# Patient Record
Sex: Female | Born: 1985 | Race: White | Hispanic: No | Marital: Married | Smoking: Former smoker
Health system: Southern US, Community
[De-identification: ages and names within clinical notes are randomized; demographics above are authoritative.]

## PROBLEM LIST (undated history)

## (undated) DIAGNOSIS — F32A Depression, unspecified: Secondary | ICD-10-CM

## (undated) DIAGNOSIS — I82409 Acute embolism and thrombosis of unspecified deep veins of unspecified lower extremity: Secondary | ICD-10-CM

## (undated) DIAGNOSIS — R1310 Dysphagia, unspecified: Secondary | ICD-10-CM

## (undated) DIAGNOSIS — G629 Polyneuropathy, unspecified: Secondary | ICD-10-CM

## (undated) DIAGNOSIS — E109 Type 1 diabetes mellitus without complications: Secondary | ICD-10-CM

## (undated) DIAGNOSIS — E785 Hyperlipidemia, unspecified: Secondary | ICD-10-CM

## (undated) DIAGNOSIS — I469 Cardiac arrest, cause unspecified: Secondary | ICD-10-CM

## (undated) DIAGNOSIS — I10 Essential (primary) hypertension: Secondary | ICD-10-CM

## (undated) DIAGNOSIS — F329 Major depressive disorder, single episode, unspecified: Secondary | ICD-10-CM

## (undated) DIAGNOSIS — M6281 Muscle weakness (generalized): Secondary | ICD-10-CM

## (undated) DIAGNOSIS — I509 Heart failure, unspecified: Secondary | ICD-10-CM

## (undated) DIAGNOSIS — I255 Ischemic cardiomyopathy: Secondary | ICD-10-CM

## (undated) DIAGNOSIS — I219 Acute myocardial infarction, unspecified: Secondary | ICD-10-CM

## (undated) DIAGNOSIS — R569 Unspecified convulsions: Secondary | ICD-10-CM

## (undated) DIAGNOSIS — H3500 Unspecified background retinopathy: Secondary | ICD-10-CM

## (undated) DIAGNOSIS — R4189 Other symptoms and signs involving cognitive functions and awareness: Secondary | ICD-10-CM

## (undated) DIAGNOSIS — D6861 Antiphospholipid syndrome: Secondary | ICD-10-CM

## (undated) DIAGNOSIS — E875 Hyperkalemia: Secondary | ICD-10-CM

## (undated) DIAGNOSIS — I639 Cerebral infarction, unspecified: Secondary | ICD-10-CM

## (undated) DIAGNOSIS — G9341 Metabolic encephalopathy: Secondary | ICD-10-CM

## (undated) HISTORY — DX: Antiphospholipid syndrome: D68.61

## (undated) HISTORY — PX: EYE SURGERY: SHX253

## (undated) HISTORY — DX: Acute embolism and thrombosis of unspecified deep veins of unspecified lower extremity: I82.409

## (undated) HISTORY — DX: Ischemic cardiomyopathy: I25.5

## (undated) HISTORY — DX: Hyperlipidemia, unspecified: E78.5

## (undated) HISTORY — DX: Type 1 diabetes mellitus without complications: E10.9

## (undated) HISTORY — DX: Acute myocardial infarction, unspecified: I21.9

## (undated) HISTORY — DX: Depression, unspecified: F32.A

## (undated) HISTORY — DX: Polyneuropathy, unspecified: G62.9

## (undated) HISTORY — DX: Essential (primary) hypertension: I10

## (undated) HISTORY — DX: Unspecified convulsions: R56.9

## (undated) HISTORY — DX: Other symptoms and signs involving cognitive functions and awareness: R41.89

## (undated) HISTORY — DX: Heart failure, unspecified: I50.9

## (undated) HISTORY — DX: Cerebral infarction, unspecified: I63.9

## (undated) HISTORY — DX: Cardiac arrest, cause unspecified: I46.9

## (undated) HISTORY — PX: CARDIAC SURGERY: SHX584

## (undated) HISTORY — DX: Metabolic encephalopathy: G93.41

## (undated) HISTORY — DX: Major depressive disorder, single episode, unspecified: F32.9

## (undated) HISTORY — PX: FEMORAL BYPASS: SHX50

## (undated) HISTORY — DX: Unspecified background retinopathy: H35.00

## (undated) HISTORY — DX: Muscle weakness (generalized): M62.81

## (undated) HISTORY — DX: Hyperkalemia: E87.5

## (undated) HISTORY — PX: ANKLE SURGERY: SHX546

## (undated) HISTORY — PX: TRACHELECTOMY: SHX6586

## (undated) HISTORY — DX: Dysphagia, unspecified: R13.10

---

## 2015-12-31 ENCOUNTER — Telehealth: Payer: Self-pay | Admitting: Neurology

## 2015-12-31 ENCOUNTER — Other Ambulatory Visit: Payer: Self-pay | Admitting: *Deleted

## 2015-12-31 ENCOUNTER — Encounter: Payer: Self-pay | Admitting: Neurology

## 2015-12-31 ENCOUNTER — Ambulatory Visit (INDEPENDENT_AMBULATORY_CARE_PROVIDER_SITE_OTHER): Payer: Medicare Other | Admitting: Neurology

## 2015-12-31 ENCOUNTER — Encounter: Payer: Self-pay | Admitting: *Deleted

## 2015-12-31 VITALS — BP 127/92 | HR 110

## 2015-12-31 DIAGNOSIS — I639 Cerebral infarction, unspecified: Secondary | ICD-10-CM | POA: Diagnosis not present

## 2015-12-31 DIAGNOSIS — E119 Type 2 diabetes mellitus without complications: Secondary | ICD-10-CM | POA: Insufficient documentation

## 2015-12-31 DIAGNOSIS — R569 Unspecified convulsions: Secondary | ICD-10-CM

## 2015-12-31 MED ORDER — DIVALPROEX SODIUM 125 MG PO CSDR: 500.0000 mg | DELAYED_RELEASE_CAPSULE | Freq: Two times a day (BID) | ORAL | 11 refills | Status: DC

## 2015-12-31 NOTE — Telephone Encounter (Signed)
Faxed order to [facility]

## 2015-12-31 NOTE — Telephone Encounter (Signed)
Kathie RhodesBetty is calling in reference to MRI order, needing to get clarification on Dx. Phone # [phone] and fax # [phone].

## 2015-12-31 NOTE — Telephone Encounter (Signed)
Rosa w/[facility] is calling to confirm medication divalproex (DEPAKOTE SPRINKLES) 125 MG capsule for the patient.  She states the patient takes Keppra instead.

## 2015-12-31 NOTE — Telephone Encounter (Addendum)
Returned call to [place] - she is requesting clarification on Depakote dosage and needs new prescription for liquid form (easier to give in PEG tube). Dr. Terrace ArabiaYan has updated the rx and instructed patient to take both Depakote and Keppra. Faxed to  [phone] - Rosa's attention.

## 2015-12-31 NOTE — Progress Notes (Signed)
PATIENT: Tanya Bentley DOB: 07/02/1985  Chief Complaint  Patient presents with  . Seizures    She is here with her husband, Duffy RhodyStanley, to have her seizure activity evaluated.  Her most recent event was sometime this month.  . Cerebrovascular Accident    She had a stroke on 10/03/15.  She is currently living at the [facility] in [place] for rehab.  Marland Kitchen. PCP    Toma DeitersXaje A Hasanaj, MD    PE HISTORICAL  Tanya Bentley is 30 years old right-handed female, accompanied by her husband, seen in refer by her primary care doctor Toma DeitersXaje A Hasanaj for evaluation of stroke, seizure, initial evaluation was December 31 2015.  She had insulin-dependent diabetes since age 30, with brittle diabetes control, frequently suffered hyper and hypoglycemia episode. She suffered first heart attack in 2014, more extensive heart attack in 2016, with cardiac arrest, she also had antiphospholipid syndrome, had previous multiple miscarriage, now has 2 healthy children at age 30 and 954,  On October 03 2015, while at home, she was noted to have confusion, slurred speech, later developed bilateral droopy eyelid, unequal pupil, then was brought to the local emergency room, lost consciousness about 60 minutes after symptom onset. Her husband,  She was later discharged to rehabilitation, was able to raise her left arm against gravity, bearing weight with her left leg, but over the past few weeks, she was noted to have more declining functional status, could no longer move her left arm, no longer bearing weight with her left leg.  I reviewed laboratory evaluation in November 2017, CBC was elevated 12, hemoglobin 9.8, BMP showed elevated creatinine 1.67, sodium of 150, lipid profile, cholesterol 147, LDL 87, hemoglobin on December 2017 was 8.4  Chest x-ray in December 13 2015, there was no active disease process,  Medication list, amlodipine 10 mg daily, Depakote sprinkle 125 mg daily, Lantus 10 units subcutaneously, Lasix 20 mg daily, Lipitor  40 mg at bedtime, Pepcid 20 mg in the evening, Plavix 75 mg daily, Xelreto 20 mg daily, Keppra 2500 mg twice a day   Reviewed extensive medical record, stroke secondary to thromboembolic event, it happened when she was taking Xelrato, with antiphospholipid antibody, ischemic cardiomyopathy with ejection fraction of 25%, dysphagia, when patient presented to the emergency room at [facility], she had sudden decline, GCS went down to 5, temperature of 32, withdraw to painful stimulation only, per record, she had a massive stroke secondary to subtherapeutic Coumadin level, EEG showed no significant epileptiform discharge, she later had significant dysphagia, status post PEG tube placement, fluctuating glucose level,  Husband also reported a history of seizure, first seizure was in 2005, second seizure was in 2012, generalized tonic-clonic seizure, recurrent seizure in 2015, since her recent stroke in September 2017, she was noted to have recurrent episode of left facial twitching  She now lives in a rehabilitation center, has dense left hemiparesis, nonambulatory, right ptosis, disconjugate eye movement, dysarthria, PEG tube placement, previously had tracheostomy following her cardiac arrest,  REVIEW OF SYSTEMS: Full 14 system review of systems performed and notable only for as above   ALLERGIES: Allergies  Allergen Reactions  . Morphine And Related     Feels like there is "lava inside her brain".  . Tramadol     Caused seizure.    HOME MEDICATIONS: No current outpatient prescriptions on file.   No current facility-administered medications for this visit.     PAST MEDICAL HISTORY: Past Medical History:  Diagnosis Date  .  Antiphospholipid antibody syndrome (HCC)   . Cardiac arrest [facility])    History of ventricular tachycardia arrest.  . Chronic heart failure (HCC)   . CVA (cerebral vascular accident) (HCC)   . Deep vein thrombosis (DVT) (HCC)   . Depression   . Diabetes mellitus  type 1 (HCC)   . Disorder of consciousness   . Dysphagia   . Heart attack   . Hyperkalemia   . Hypertension   . Ischemic cardiomyopathy   . Metabolic encephalopathy   . Neuropathy (HCC)   . Retinopathy   . Seizure (HCC)     PAST SURGICAL HISTORY: Past Surgical History:  Procedure Laterality Date  . ANKLE SURGERY Right   . CARDIAC SURGERY     multiple stents  . CESAREAN SECTION     x 2  . EYE SURGERY Bilateral   . FEMORAL BYPASS Bilateral   . TRACHELECTOMY      FAMILY HISTORY: Family History  Problem Relation Age of Onset  . Depression Mother   . Hypertension Mother   . Heart disease Father     SOCIAL HISTORY:  Social History   Social History  . Marital status: Married    Spouse name: N/A  . Number of children: 2  . Years of education: Some college   Occupational History  . Diabled    Social History Main Topics  . Smoking status: Former Games developer  . Smokeless tobacco: Former Neurosurgeon     Comment: Stopped since stroke - 09/2015.  Marland Kitchen Alcohol use No  . Drug use: No  . Sexual activity: Not on file   Other Topics Concern  . Not on file   Social History Narrative   Currently resides at the [facility] in [place] for rehab from her stroke on 10/03/15.   Right-handed.   No caffeine use.     PHYSICAL EXAM   Vitals:   12/31/15 1020  BP: (!) 127/92  Pulse: (!) 110    Not recorded      There is no height or weight on file to calculate BMI.  PHYSICAL EXAMNIATION:  Gen: NAD, conversant, well nourised, obese, well groomed                     Cardiovascular: Regular rate rhythm, no peripheral edema, warm, nontender. Eyes: Conjunctivae clear without exudates or hemorrhage Neck: Supple, no carotid bruits. Pulmonary: Clear to auscultation bilaterally   NEUROLOGICAL EXAM:  MENTAL STATUS: Speech:    Speech is normal; fluent and spontaneous with normal comprehension.  Cognition:     Orientation to time, place and person     Normal recent and remote memory      Normal Attention span and concentration     Normal Language, naming, repeating,spontaneous speech     Fund of knowledge   CRANIAL NERVES: CN II: Visual fields are full to confrontation. Fundoscopic exam is normal with sharp discs and no vascular changes. Pupils are round equal and briskly reactive to light. CN III, IV, VI: extraocular movement are normal. No ptosis. CN V: Facial sensation is intact to pinprick in all 3 divisions bilaterally. Corneal responses are intact.  CN VII: Face is symmetric with normal eye closure and smile. CN VIII: Hearing is normal to rubbing fingers CN IX, X: Palate elevates symmetrically. Phonation is normal. CN XI: Head turning and shoulder shrug are intact CN XII: Tongue is midline with normal movements and no atrophy.  MOTOR: Dense spastic left hemiparesis, only trace movement of  left upper extremity muscles, no movement on left lower extremity, free movement of right arm, antigravity movement of right lower extremity  REFLEXES: Reflexes are hypoactive. Plantar responses are flexor.  SENSORY: Intact to light touch, pinprick, positional sensation and vibratory sensation are intact in fingers and toes.  COORDINATION: Normal right finger to nose  GAIT/STANCE: Nonambulatory   DIAGNOSTIC DATA (LABS, IMAGING, TESTING) - I reviewed patient records, labs, notes, testing and imaging myself where available.   ASSESSMENT AND PLAN  Tanya Bentley is a 30 y.o. female   Reported history of multiple stroke,   vascular risk factor including brittle type 1 diabetes, antiphospholipid antibody, coronary artery disease, congestive heart failure,  Repeat MRI of the brain without contrast  Epilepsy  Keep Keppra at 1.5 ML= 1500 mg twice a day  Increase Depakote sprinkle 125 mg 4 tablets twice a day  Repeat EEG   Levert FeinsteinYijun Daton Szilagyi, M.D. Ph.D.  [facility] [address], [address] [place], [place] [address] Ph: [phone] Fax: [phone]  CC:  Toma DeitersXaje A Hasanaj, MD

## 2016-01-01 ENCOUNTER — Other Ambulatory Visit: Payer: Self-pay | Admitting: *Deleted

## 2016-01-01 MED ORDER — VALPROATE SODIUM 250 MG/5ML PO SOLN: 500.0000 mg | Freq: Two times a day (BID) | ORAL | 11 refills | Status: AC

## 2016-01-01 MED ORDER — VALPROATE SODIUM 250 MG/5ML PO SOLN: 500.0000 mg | Freq: Two times a day (BID) | ORAL | 11 refills | Status: DC

## 2016-01-13 ENCOUNTER — Ambulatory Visit: Payer: Self-pay | Admitting: Cardiovascular Disease

## 2016-02-06 ENCOUNTER — Other Ambulatory Visit: Payer: Medicare Other

## 2016-02-09 ENCOUNTER — Encounter: Payer: Self-pay | Admitting: Neurology

## 2016-02-09 ENCOUNTER — Ambulatory Visit (HOSPITAL_COMMUNITY)
Admission: AD | Admit: 2016-02-09 | Discharge: 2016-02-09 | Disposition: A | Discharge status: Home / Self Care | Payer: Medicare Other | Source: Other Acute Inpatient Hospital | Attending: Internal Medicine | Admitting: Internal Medicine

## 2016-02-09 ENCOUNTER — Inpatient Hospital Stay
Admission: AD | Admit: 2016-02-09 | Discharge: 2016-03-16 | Disposition: A | Discharge status: Skilled Nursing Facility | Payer: Medicaid Other | Source: Ambulatory Visit | Attending: Internal Medicine | Admitting: Internal Medicine

## 2016-02-09 ENCOUNTER — Institutional Professional Consult (permissible substitution) (HOSPITAL_COMMUNITY): Payer: Medicaid Other

## 2016-02-09 DIAGNOSIS — Z9889 Other specified postprocedural states: Secondary | ICD-10-CM

## 2016-02-09 DIAGNOSIS — J9690 Respiratory failure, unspecified, unspecified whether with hypoxia or hypercapnia: Secondary | ICD-10-CM

## 2016-02-09 DIAGNOSIS — Z930 Tracheostomy status: Secondary | ICD-10-CM

## 2016-02-09 DIAGNOSIS — R0682 Tachypnea, not elsewhere classified: Secondary | ICD-10-CM

## 2016-02-09 DIAGNOSIS — J90 Pleural effusion, not elsewhere classified: Secondary | ICD-10-CM

## 2016-02-09 DIAGNOSIS — E119 Type 2 diabetes mellitus without complications: Secondary | ICD-10-CM | POA: Insufficient documentation

## 2016-02-09 DIAGNOSIS — Z931 Gastrostomy status: Secondary | ICD-10-CM

## 2016-02-09 LAB — BLOOD GAS, ARTERIAL
ACID-BASE EXCESS: 6.8 mmol/L — AB (ref 0.0–2.0)
Bicarbonate: 29.7 mmol/L — ABNORMAL HIGH (ref 20.0–28.0)
FIO2: 0.3
MECHVT: 420 mL
O2 Saturation: 99.4 %
PEEP/CPAP: 5 cmH2O
PO2 ART: 134 mmHg — AB (ref 83.0–108.0)
Patient temperature: 98.6
RATE: 12 resp/min
pCO2 arterial: 33.7 mmHg (ref 32.0–48.0)
pH, Arterial: 7.553 — ABNORMAL HIGH (ref 7.350–7.450)

## 2016-02-09 LAB — PROTIME-INR
INR: 1.57
PROTHROMBIN TIME: 18.9 s — AB (ref 11.4–15.2)

## 2016-02-10 ENCOUNTER — Other Ambulatory Visit (HOSPITAL_COMMUNITY): Payer: Medicaid Other

## 2016-02-10 LAB — CBC
HCT: 30.9 % — ABNORMAL LOW (ref 36.0–46.0)
Hemoglobin: 9.2 g/dL — ABNORMAL LOW (ref 12.0–15.0)
MCH: 29.5 pg (ref 26.0–34.0)
MCHC: 29.8 g/dL — ABNORMAL LOW (ref 30.0–36.0)
MCV: 99 fL (ref 78.0–100.0)
Platelets: 188 10*3/uL (ref 150–400)
RBC: 3.12 MIL/uL — ABNORMAL LOW (ref 3.87–5.11)
RDW: 23 % — AB (ref 11.5–15.5)
WBC: 6 10*3/uL (ref 4.0–10.5)

## 2016-02-10 LAB — BASIC METABOLIC PANEL
Anion gap: 10 (ref 5–15)
BUN: 40 mg/dL — AB (ref 6–20)
CALCIUM: 8.4 mg/dL — AB (ref 8.9–10.3)
CO2: 30 mmol/L (ref 22–32)
CREATININE: 0.78 mg/dL (ref 0.44–1.00)
Chloride: 103 mmol/L (ref 101–111)
GFR calc Af Amer: >60 mL/min (ref >60)
Glucose, Bld: 299 mg/dL — ABNORMAL HIGH (ref 65–99)
Potassium: 4.1 mmol/L (ref 3.5–5.1)
SODIUM: 143 mmol/L (ref 135–145)

## 2016-02-10 LAB — PROTIME-INR
INR: 1.6
PROTHROMBIN TIME: 19.2 s — AB (ref 11.4–15.2)

## 2016-02-10 MED ORDER — DIATRIZOATE MEGLUMINE & SODIUM 66-10 % PO SOLN
ORAL | Status: AC
  Filled 2016-02-10: qty 30

## 2016-02-10 MED ORDER — DIATRIZOATE MEGLUMINE & SODIUM 66-10 % PO SOLN: 30.0000 mL | Freq: Once | ORAL | Status: DC

## 2016-02-11 LAB — PROTIME-INR
INR: 2.1
PROTHROMBIN TIME: 23.9 s — AB (ref 11.4–15.2)

## 2016-02-12 LAB — BASIC METABOLIC PANEL
Anion gap: 12 (ref 5–15)
BUN: 39 mg/dL — AB (ref 6–20)
CALCIUM: 8.4 mg/dL — AB (ref 8.9–10.3)
CO2: 28 mmol/L (ref 22–32)
CREATININE: 0.84 mg/dL (ref 0.44–1.00)
Chloride: 104 mmol/L (ref 101–111)
GFR calc Af Amer: >60 mL/min (ref >60)
Glucose, Bld: 533 mg/dL (ref 65–99)
Potassium: 3.8 mmol/L (ref 3.5–5.1)
SODIUM: 144 mmol/L (ref 135–145)

## 2016-02-12 LAB — PROTIME-INR
INR: 2.05
PROTHROMBIN TIME: 23.4 s — AB (ref 11.4–15.2)

## 2016-02-12 LAB — HEMOGLOBIN A1C
HEMOGLOBIN A1C: 6.7 % — AB (ref 4.8–5.6)
MEAN PLASMA GLUCOSE: 146 mg/dL

## 2016-02-13 LAB — PROTIME-INR
INR: 2.45
PROTHROMBIN TIME: 27 s — AB (ref 11.4–15.2)

## 2016-02-13 LAB — LEVETIRACETAM LEVEL: Levetiracetam Lvl: 1.1 ug/mL — ABNORMAL LOW (ref 10.0–40.0)

## 2016-02-14 LAB — PROTIME-INR
INR: 2.8
PROTHROMBIN TIME: 30.1 s — AB (ref 11.4–15.2)

## 2016-02-14 LAB — VALPROIC ACID LEVEL: Valproic Acid Lvl: 50 ug/mL (ref 50.0–100.0)

## 2016-02-15 LAB — PROTIME-INR
INR: 2.71
Prothrombin Time: 29.3 seconds — ABNORMAL HIGH (ref 11.4–15.2)

## 2016-02-16 ENCOUNTER — Other Ambulatory Visit (HOSPITAL_COMMUNITY): Payer: Medicaid Other

## 2016-02-16 LAB — CBC
HCT: 28.7 % — ABNORMAL LOW (ref 36.0–46.0)
HEMOGLOBIN: 8.6 g/dL — AB (ref 12.0–15.0)
MCH: 29.7 pg (ref 26.0–34.0)
MCHC: 30 g/dL (ref 30.0–36.0)
MCV: 99 fL (ref 78.0–100.0)
Platelets: 169 10*3/uL (ref 150–400)
RBC: 2.9 MIL/uL — ABNORMAL LOW (ref 3.87–5.11)
RDW: 20.3 % — AB (ref 11.5–15.5)
WBC: 4.4 10*3/uL (ref 4.0–10.5)

## 2016-02-16 LAB — BASIC METABOLIC PANEL
ANION GAP: 9 (ref 5–15)
BUN: 41 mg/dL — ABNORMAL HIGH (ref 6–20)
CALCIUM: 8.5 mg/dL — AB (ref 8.9–10.3)
CHLORIDE: 104 mmol/L (ref 101–111)
CO2: 29 mmol/L (ref 22–32)
CREATININE: 0.85 mg/dL (ref 0.44–1.00)
GFR calc non Af Amer: >60 mL/min (ref >60)
Glucose, Bld: 391 mg/dL — ABNORMAL HIGH (ref 65–99)
Potassium: 3.9 mmol/L (ref 3.5–5.1)
SODIUM: 142 mmol/L (ref 135–145)

## 2016-02-16 LAB — PROTIME-INR
INR: 3.36
PROTHROMBIN TIME: 34.8 s — AB (ref 11.4–15.2)

## 2016-02-17 LAB — PROTIME-INR
INR: 3.16
Prothrombin Time: 33.2 seconds — ABNORMAL HIGH (ref 11.4–15.2)

## 2016-02-18 LAB — PROTIME-INR
INR: 1.72
PROTHROMBIN TIME: 20.3 s — AB (ref 11.4–15.2)

## 2016-02-21 LAB — BASIC METABOLIC PANEL
Anion gap: 12 (ref 5–15)
BUN: 31 mg/dL — AB (ref 6–20)
CHLORIDE: 104 mmol/L (ref 101–111)
CO2: 30 mmol/L (ref 22–32)
CREATININE: 0.73 mg/dL (ref 0.44–1.00)
Calcium: 8.4 mg/dL — ABNORMAL LOW (ref 8.9–10.3)
Glucose, Bld: 257 mg/dL — ABNORMAL HIGH (ref 65–99)
POTASSIUM: 3.7 mmol/L (ref 3.5–5.1)
SODIUM: 146 mmol/L — AB (ref 135–145)

## 2016-02-21 LAB — CBC
HCT: 31.9 % — ABNORMAL LOW (ref 36.0–46.0)
Hemoglobin: 9.5 g/dL — ABNORMAL LOW (ref 12.0–15.0)
MCH: 30.2 pg (ref 26.0–34.0)
MCHC: 29.8 g/dL — AB (ref 30.0–36.0)
MCV: 101.3 fL — ABNORMAL HIGH (ref 78.0–100.0)
PLATELETS: 207 10*3/uL (ref 150–400)
RBC: 3.15 MIL/uL — AB (ref 3.87–5.11)
RDW: 20 % — ABNORMAL HIGH (ref 11.5–15.5)
WBC: 10.2 10*3/uL (ref 4.0–10.5)

## 2016-02-21 LAB — PROTIME-INR
INR: 1.11
PROTHROMBIN TIME: 14.4 s (ref 11.4–15.2)

## 2016-02-26 ENCOUNTER — Other Ambulatory Visit (HOSPITAL_COMMUNITY): Payer: Medicaid Other

## 2016-02-26 LAB — URINALYSIS, ROUTINE W REFLEX MICROSCOPIC
Bilirubin Urine: NEGATIVE
Glucose, UA: NEGATIVE mg/dL
Ketones, ur: NEGATIVE mg/dL
Nitrite: NEGATIVE
PH: 5 (ref 5.0–8.0)
Protein, ur: 100 mg/dL — AB
Specific Gravity, Urine: 1.015 (ref 1.005–1.030)

## 2016-02-26 LAB — CBC
HEMATOCRIT: 29.9 % — AB (ref 36.0–46.0)
Hemoglobin: 8.9 g/dL — ABNORMAL LOW (ref 12.0–15.0)
MCH: 29.8 pg (ref 26.0–34.0)
MCHC: 29.8 g/dL — ABNORMAL LOW (ref 30.0–36.0)
MCV: 100 fL (ref 78.0–100.0)
Platelets: 324 10*3/uL (ref 150–400)
RBC: 2.99 MIL/uL — AB (ref 3.87–5.11)
RDW: 19.1 % — ABNORMAL HIGH (ref 11.5–15.5)
WBC: 8.7 10*3/uL (ref 4.0–10.5)

## 2016-02-27 ENCOUNTER — Institutional Professional Consult (permissible substitution) (HOSPITAL_COMMUNITY): Payer: Medicaid Other

## 2016-02-27 LAB — CBC
HEMATOCRIT: 28.3 % — AB (ref 36.0–46.0)
Hemoglobin: 8.6 g/dL — ABNORMAL LOW (ref 12.0–15.0)
MCH: 29.9 pg (ref 26.0–34.0)
MCHC: 30.4 g/dL (ref 30.0–36.0)
MCV: 98.3 fL (ref 78.0–100.0)
Platelets: 313 10*3/uL (ref 150–400)
RBC: 2.88 MIL/uL — ABNORMAL LOW (ref 3.87–5.11)
RDW: 19 % — AB (ref 11.5–15.5)
WBC: 9 10*3/uL (ref 4.0–10.5)

## 2016-02-27 LAB — COMPREHENSIVE METABOLIC PANEL
ALBUMIN: 2 g/dL — AB (ref 3.5–5.0)
ALT: 30 U/L (ref 14–54)
AST: 44 U/L — AB (ref 15–41)
Alkaline Phosphatase: 95 U/L (ref 38–126)
Anion gap: 9 (ref 5–15)
BILIRUBIN TOTAL: 0.5 mg/dL (ref 0.3–1.2)
BUN: 31 mg/dL — AB (ref 6–20)
CHLORIDE: 104 mmol/L (ref 101–111)
CO2: 31 mmol/L (ref 22–32)
CREATININE: 0.74 mg/dL (ref 0.44–1.00)
Calcium: 8.7 mg/dL — ABNORMAL LOW (ref 8.9–10.3)
GFR calc Af Amer: >60 mL/min (ref >60)
GFR calc non Af Amer: >60 mL/min (ref >60)
GLUCOSE: 112 mg/dL — AB (ref 65–99)
POTASSIUM: 4 mmol/L (ref 3.5–5.1)
Sodium: 144 mmol/L (ref 135–145)
Total Protein: 6.4 g/dL — ABNORMAL LOW (ref 6.5–8.1)

## 2016-02-28 LAB — URINE CULTURE

## 2016-02-28 LAB — CULTURE, RESPIRATORY

## 2016-02-28 LAB — CULTURE, RESPIRATORY W GRAM STAIN

## 2016-02-29 ENCOUNTER — Other Ambulatory Visit (HOSPITAL_COMMUNITY): Payer: Medicaid Other

## 2016-02-29 MED ORDER — LIDOCAINE HCL (PF) 1 % IJ SOLN
INTRAMUSCULAR | Status: AC
  Filled 2016-02-29: qty 10

## 2016-03-03 ENCOUNTER — Ambulatory Visit: Payer: Medicare Other | Admitting: Neurology

## 2016-03-07 ENCOUNTER — Other Ambulatory Visit (HOSPITAL_COMMUNITY): Payer: Medicaid Other

## 2016-03-07 LAB — CBC
HCT: 35.6 % — ABNORMAL LOW (ref 36.0–46.0)
HEMOGLOBIN: 10.8 g/dL — AB (ref 12.0–15.0)
MCH: 29.7 pg (ref 26.0–34.0)
MCHC: 30.3 g/dL (ref 30.0–36.0)
MCV: 97.8 fL (ref 78.0–100.0)
Platelets: 315 10*3/uL (ref 150–400)
RBC: 3.64 MIL/uL — AB (ref 3.87–5.11)
RDW: 18.2 % — ABNORMAL HIGH (ref 11.5–15.5)
WBC: 7.9 10*3/uL (ref 4.0–10.5)

## 2016-03-07 LAB — BASIC METABOLIC PANEL
Anion gap: 8 (ref 5–15)
BUN: 29 mg/dL — AB (ref 6–20)
CHLORIDE: 102 mmol/L (ref 101–111)
CO2: 30 mmol/L (ref 22–32)
CREATININE: 0.59 mg/dL (ref 0.44–1.00)
Calcium: 9 mg/dL (ref 8.9–10.3)
GFR calc Af Amer: >60 mL/min (ref >60)
GFR calc non Af Amer: >60 mL/min (ref >60)
Glucose, Bld: 117 mg/dL — ABNORMAL HIGH (ref 65–99)
POTASSIUM: 4.1 mmol/L (ref 3.5–5.1)
SODIUM: 140 mmol/L (ref 135–145)

## 2016-03-08 LAB — PROTEIN, TOTAL: Total Protein: 7.1 g/dL (ref 6.5–8.1)

## 2016-03-09 ENCOUNTER — Other Ambulatory Visit (HOSPITAL_COMMUNITY): Payer: Medicaid Other

## 2016-03-09 LAB — LACTATE DEHYDROGENASE, PLEURAL OR PERITONEAL FLUID: LD, Fluid: 85 U/L — ABNORMAL HIGH (ref 3–23)

## 2016-03-09 LAB — PROTEIN, PLEURAL OR PERITONEAL FLUID: Total protein, fluid: <3 g/dL

## 2016-03-09 LAB — GLUCOSE, PLEURAL OR PERITONEAL FLUID: Glucose, Fluid: 158 mg/dL

## 2016-03-09 MED ORDER — LIDOCAINE HCL 1 % IJ SOLN
INTRAMUSCULAR | Status: AC
  Filled 2016-03-09: qty 20

## 2016-03-09 NOTE — Procedures (Signed)
PROCEDURE SUMMARY:  Successful US guided right thoracentesis. Yielded 1.5 L of serosanguinous pleural fluid. Pt tolerated procedure well. No immediate complications.  Specimen was sent for labs. CXR was deferred as CT Chest has been ordered.  Brayton ElBRUNING, Lorianna Spadaccini PA-C 03/09/2016 10:29 AM

## 2016-03-10 LAB — PH, BODY FLUID: PH, BODY FLUID: 8

## 2016-03-10 LAB — CULTURE, RESPIRATORY W GRAM STAIN

## 2016-03-10 LAB — CULTURE, RESPIRATORY

## 2016-03-10 LAB — GRAM STAIN

## 2016-03-11 ENCOUNTER — Other Ambulatory Visit (HOSPITAL_COMMUNITY): Payer: Medicaid Other

## 2016-03-14 LAB — CULTURE, BODY FLUID W GRAM STAIN -BOTTLE

## 2016-03-14 LAB — CULTURE, BODY FLUID-BOTTLE: CULTURE: NO GROWTH

## 2016-06-11 DEATH — deceased

## 2017-11-24 IMAGING — DX DG CHEST 1V PORT
1 series · 1 of 1 positions shown · non-contrast
Comparison: Portable chest x-ray February 09, 2016

CLINICAL DATA: Respiratory failure, CVA, cardiac arrest, seizure
activity. History of chronic CHF, former smoker.

EXAM:
PORTABLE CHEST 1 VIEW

[chest ap]
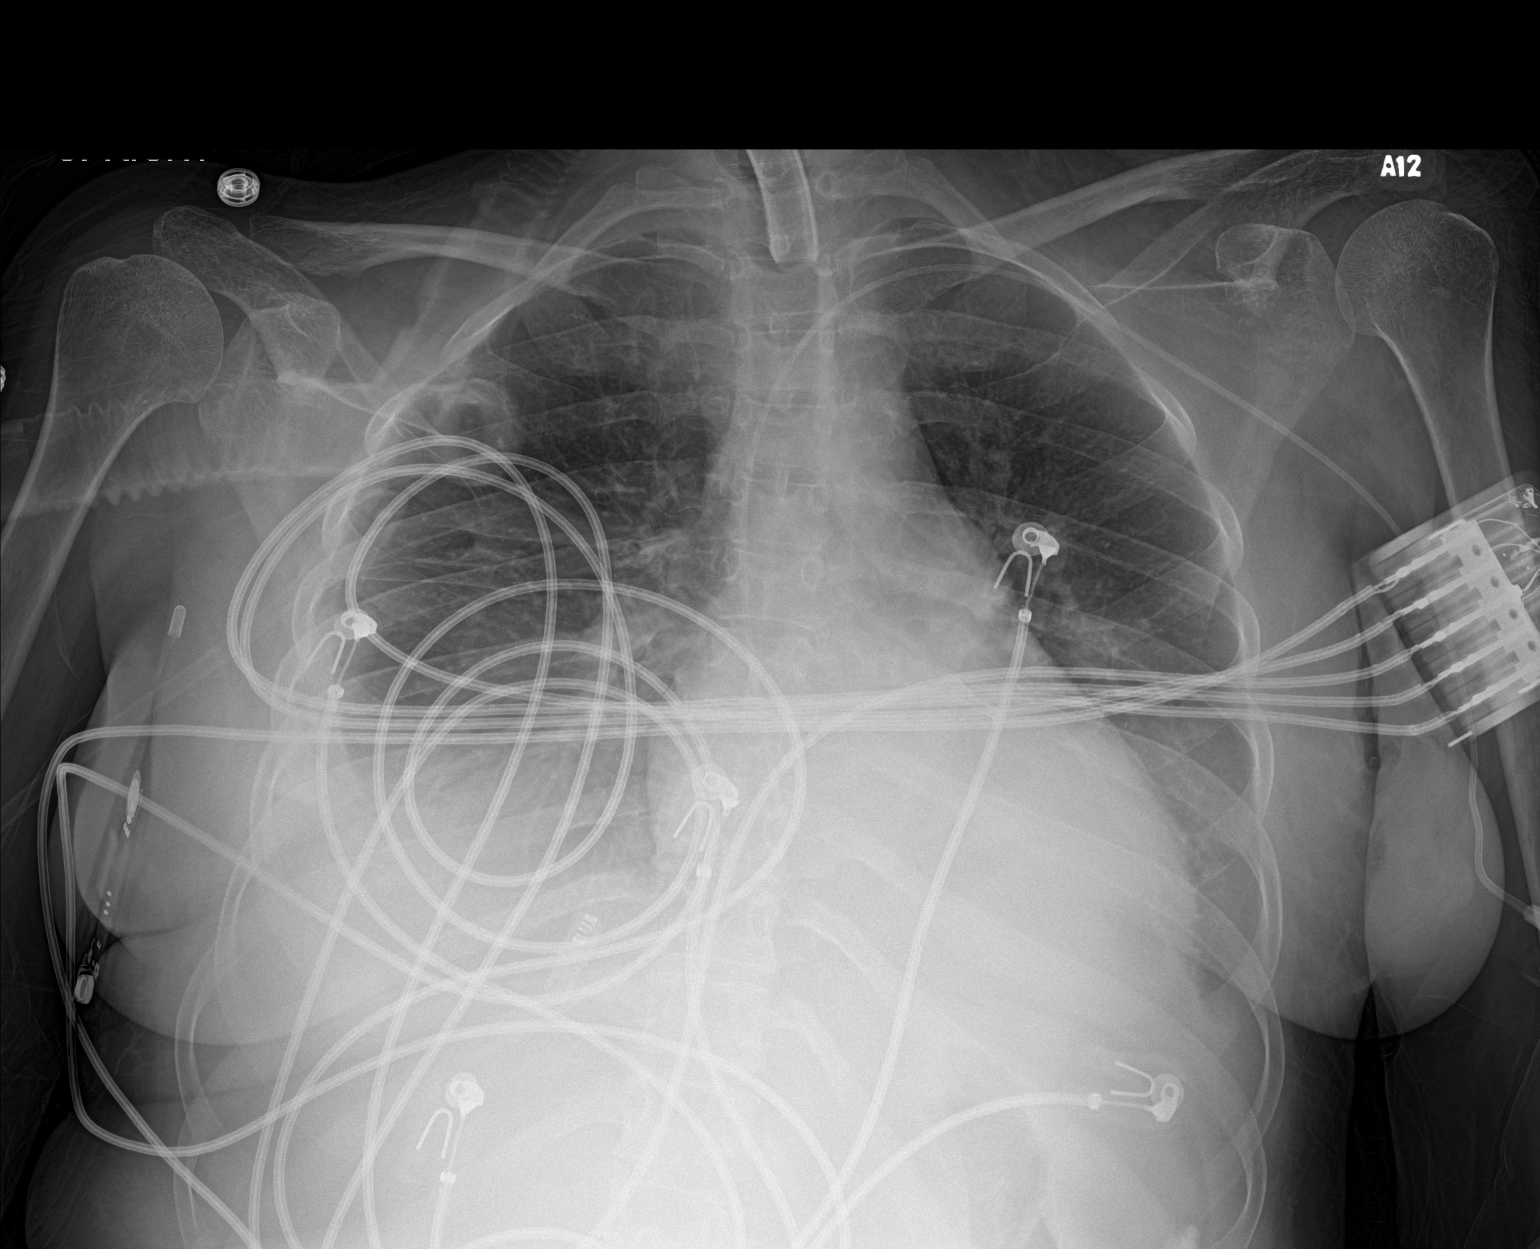

[1 of 1 positions shown; findings below may reference images not displayed]

FINDINGS: The lungs are reasonably well inflated. There is a small right
pleural effusion and trace left pleural effusion. Bibasilar
densities are slightly less conspicuous. The heart and pulmonary
vascularity are normal. The mediastinum is normal in width. The
trachea is midline. There is bibasilar atelectasis or pneumonia. The
tracheostomy tube tip projects at the superior margin of the
clavicular heads. The left-sided PICC line tip projects at the
cavoatrial junction.
IMPRESSION: Slight interval improvement in the appearance of the bibasilar
atelectasis or pneumonia and pleural effusions. No CHF. The
tracheostomy tube tip projects at the superior margin of the
clavicular heads.

## 2017-12-16 IMAGING — CT CT CHEST W/O CM
2 of 3 series · 13 of 36 positions shown, 16 images · non-contrast
Comparison: 03/07/2016 chest x-ray

CLINICAL DATA: Post thoracentesis.  Pleural effusions.

EXAM:
CT CHEST WITHOUT CONTRAST
TECHNIQUE: Multidetector CT imaging of the chest was performed following the
standard protocol without IV contrast.

[Series 201: chest without, idose (2) · axial · non-contrast · 0.66mm/px · z∈[+65,+328]mm · 10 of 125 slices shown, 13 images]
[im 10/125  mediastinal]
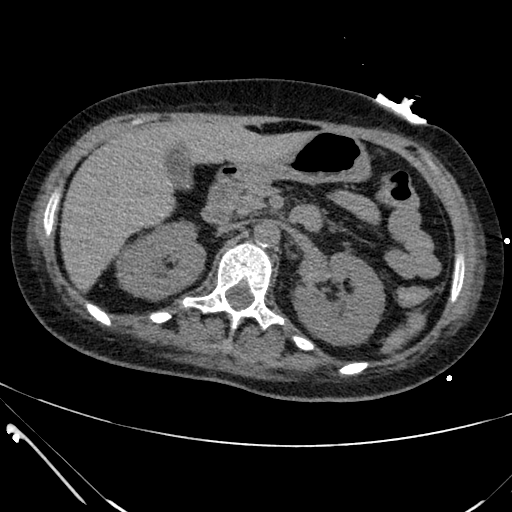
[im 10/125  lung]
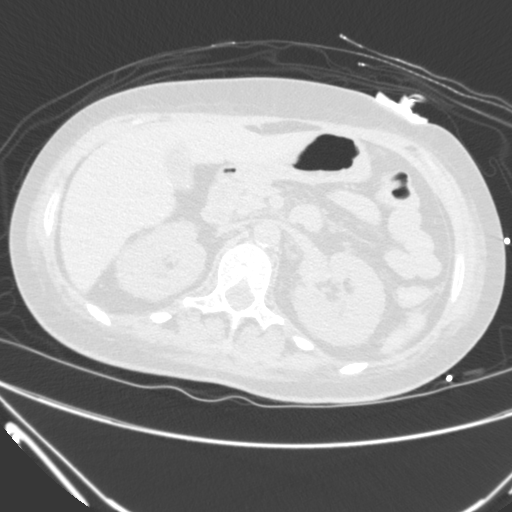
[im 19/125  lung]
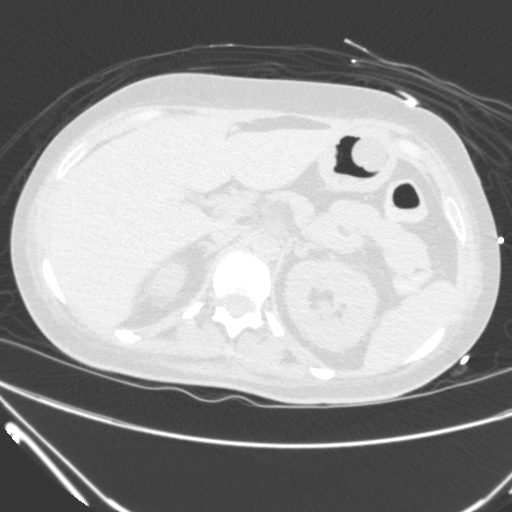
[im 33/125  lung]
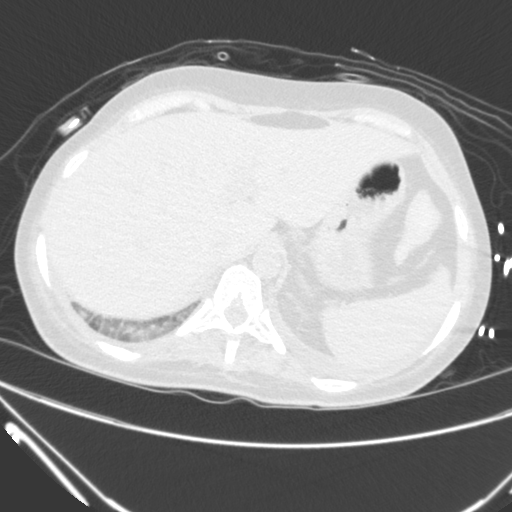
[im 46/125  lung]
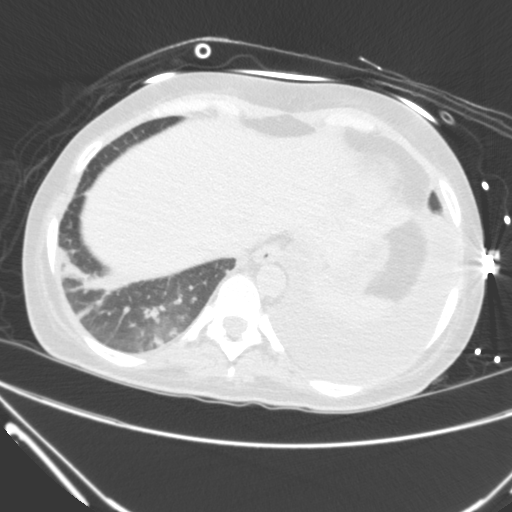
[im 56/125  mediastinal]
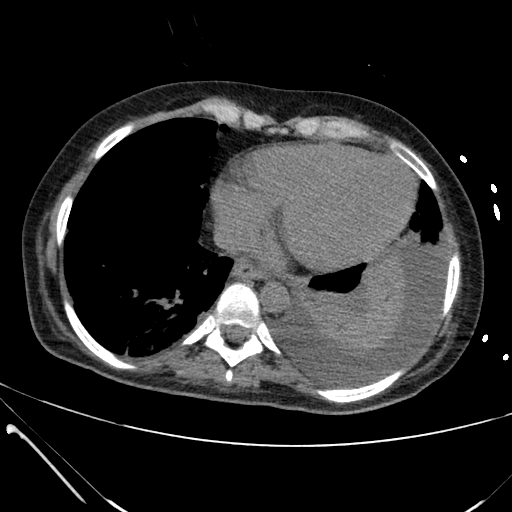
[im 56/125  lung]
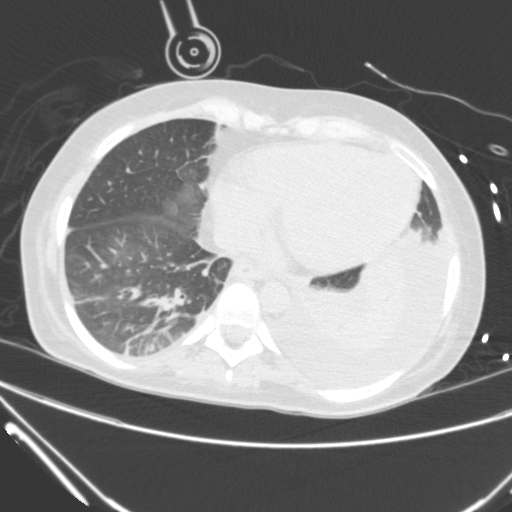
[im 69/125  lung]
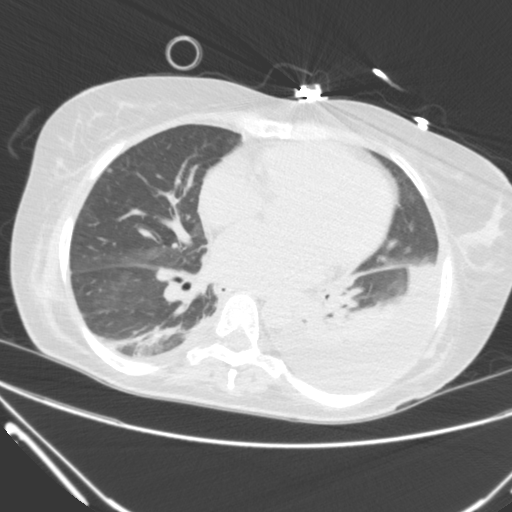
[im 79/125  lung]
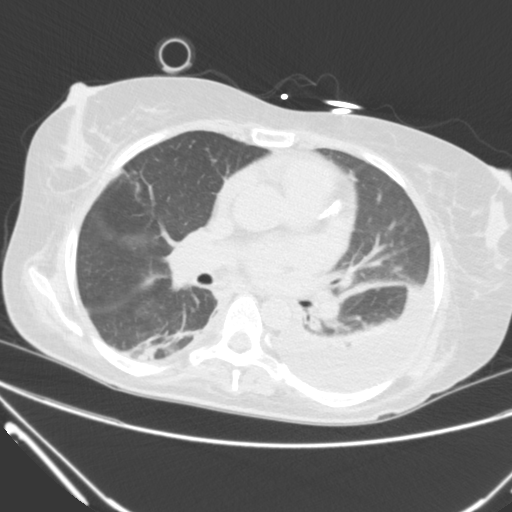
[im 92/125  lung]
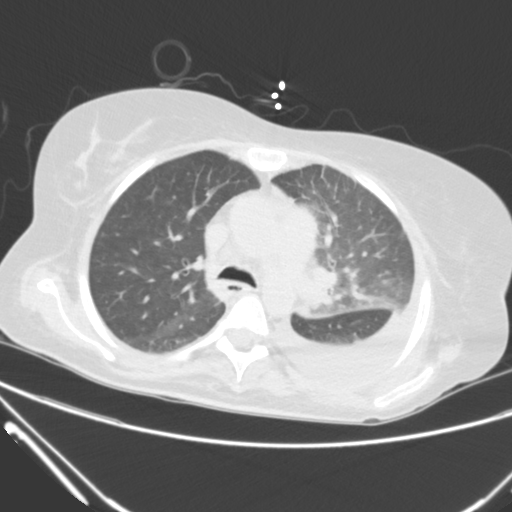
[im 106/125  mediastinal]
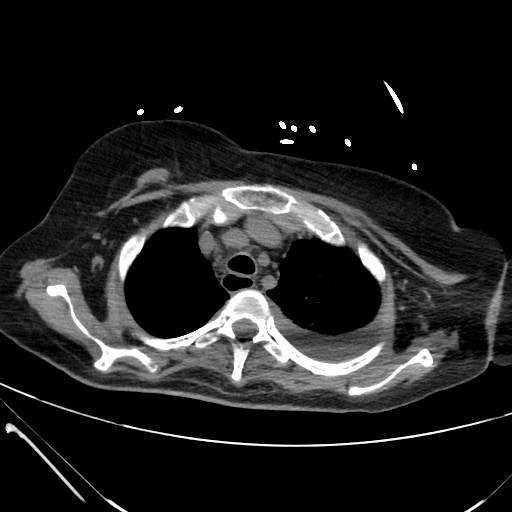
[im 106/125  lung]
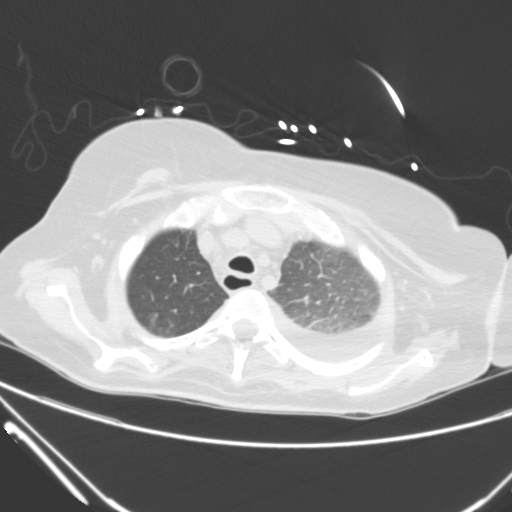
[im 115/125  lung]
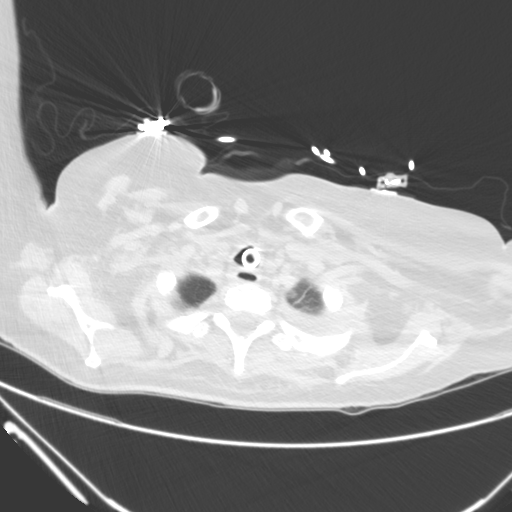

[Series 203: coronal, idose (2) · coronal · 0.45mm/px · 3 of 94 slices shown]
[im 19/94  lung]
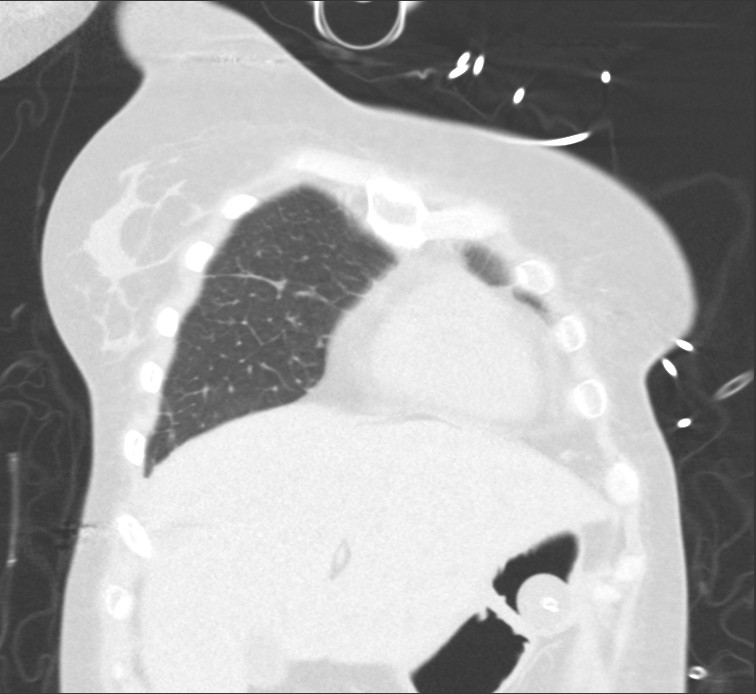
[im 38/94  lung]
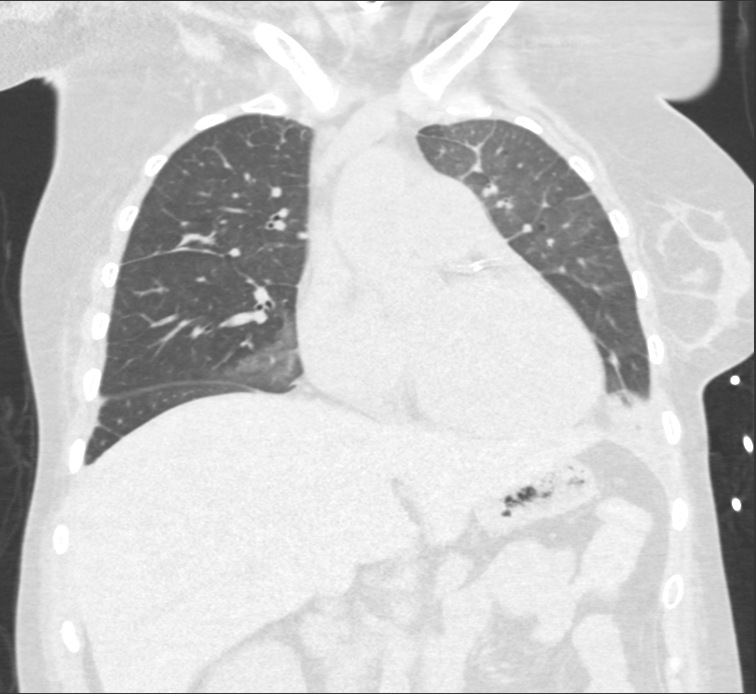
[im 56/94  lung]
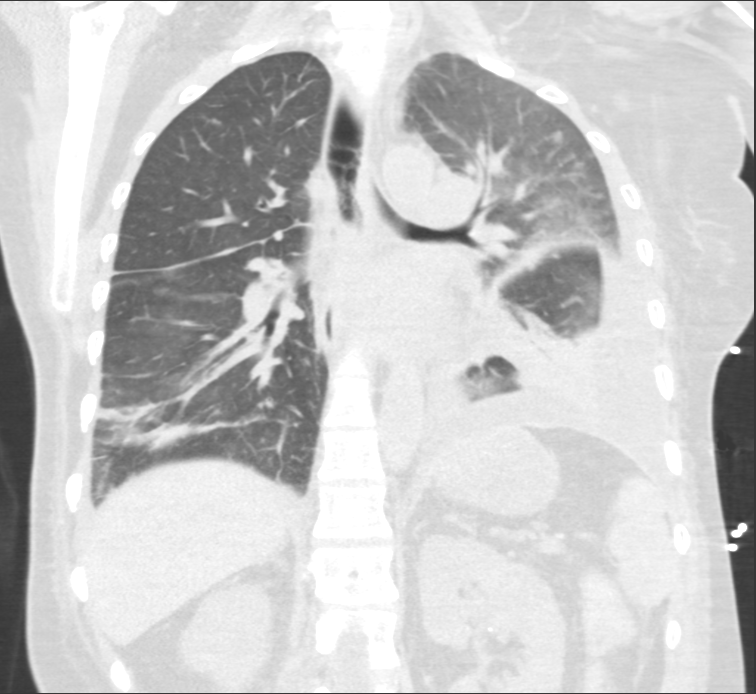

[13 of 36 positions shown; findings below may reference images not displayed]

FINDINGS: Cardiovascular: Heart is mildly enlarged. Stents are noted within
the left anterior descending and left circumflex coronary arteries.
Aorta is normal caliber.

Mediastinum/Nodes: Borderline sized mediastinal lymph nodes, likely
reactive. Tracheostomy tube in place with the tip in the mid
trachea.

Lungs/Pleura: Moderate left pleural effusion and trace right pleural
effusion, presumably status post right thoracentesis. No
pneumothorax. Areas of atelectasis noted in both lower lobes.
Ground-glass opacities noted in the right lower lobe and posterior
right middle lobe, possibly related to re-expansion edema. Mild
ground-glass opacities also noted in the left upper lobe which could
be related to mild edema or alveolitis. Branching tree-in-bud
densities are noted in the inferior right upper lobe, likely mucous
plugging/small airways disease.

Upper Abdomen: Gallstones noted within the gallbladder. Gastrostomy
tube in the stomach.

Musculoskeletal: Chest wall soft tissues are unremarkable. No acute
bony abnormality.
IMPRESSION: Moderate left pleural effusion and trace right pleural effusion,
presumably status post right thoracentesis.

Ground-glass opacities in the left upper lobe and right lower lobe
could reflect areas of early edema, possibly re-expansion edema in
the right lower lobe.

Atelectasis in both lower lobes, left greater than right.

Stents within the left anterior descending and circumflex coronary
arteries.

Mild cardiomegaly.

## 2017-12-16 IMAGING — CR DG CHEST 1V PORT
1 series · 1 of 1 positions shown · non-contrast
Comparison: Chest CT earlier this day.  Chest radiograph 03/07/2016

CLINICAL DATA: Status post thoracentesis.

EXAM:
PORTABLE CHEST 1 VIEW

[AP]
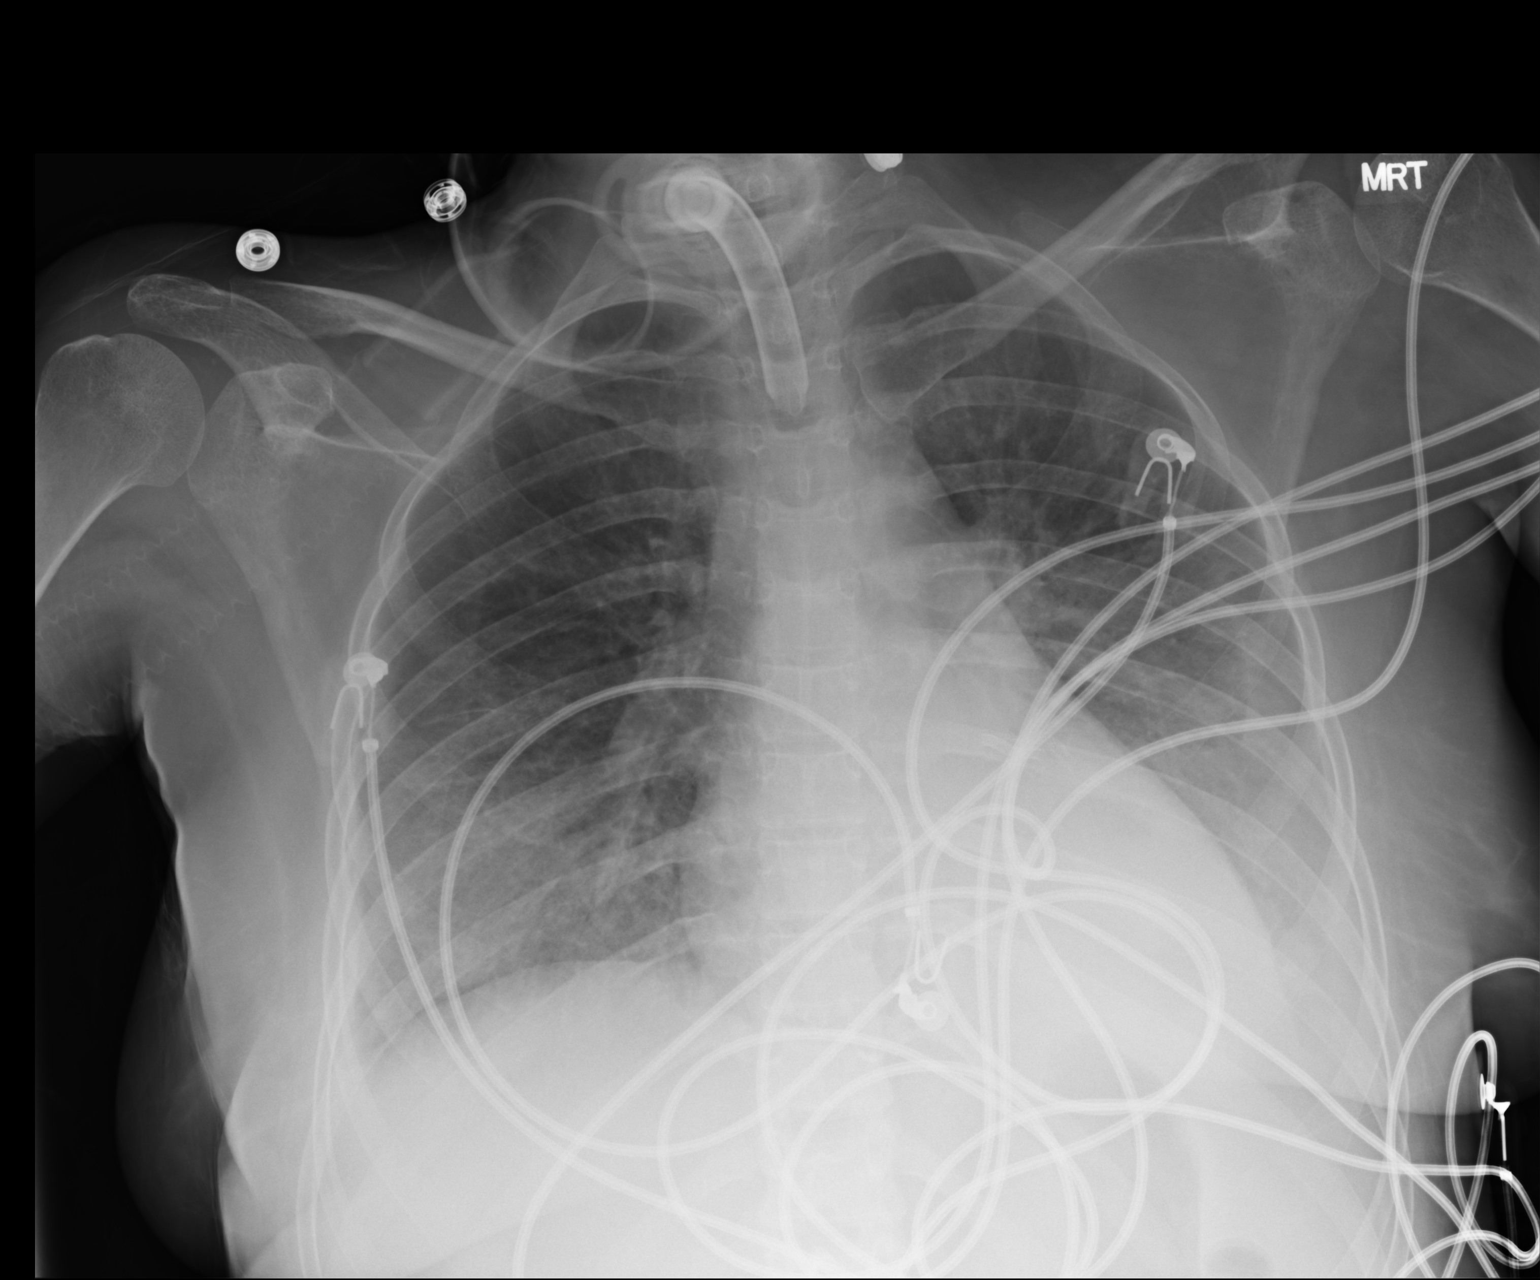

[1 of 1 positions shown; findings below may reference images not displayed]

FINDINGS: Tracheostomy tube at the thoracic inlet. Decreased size of bilateral
pleural effusions with small volume of pleural fluid persisting on
the left. Trace right costophrenic blunting. No evidence of
pneumothorax. Persistent bibasilar opacities, left greater than
right, likely atelectasis.
IMPRESSION: No pneumothorax post thoracentesis. Decreased size of bilateral
pleural effusions with persistent small volume pleural fluid and
atelectasis greater on the left.

## 2017-12-18 IMAGING — DX DG CHEST 1V PORT
1 series · 1 of 1 positions shown · non-contrast
Comparison: Single-view of the chest 03/09/2016.

CLINICAL DATA: Respiratory failure. Status post right thoracentesis
03/09/2016.

EXAM:
PORTABLE CHEST 1 VIEW

[chest ap]
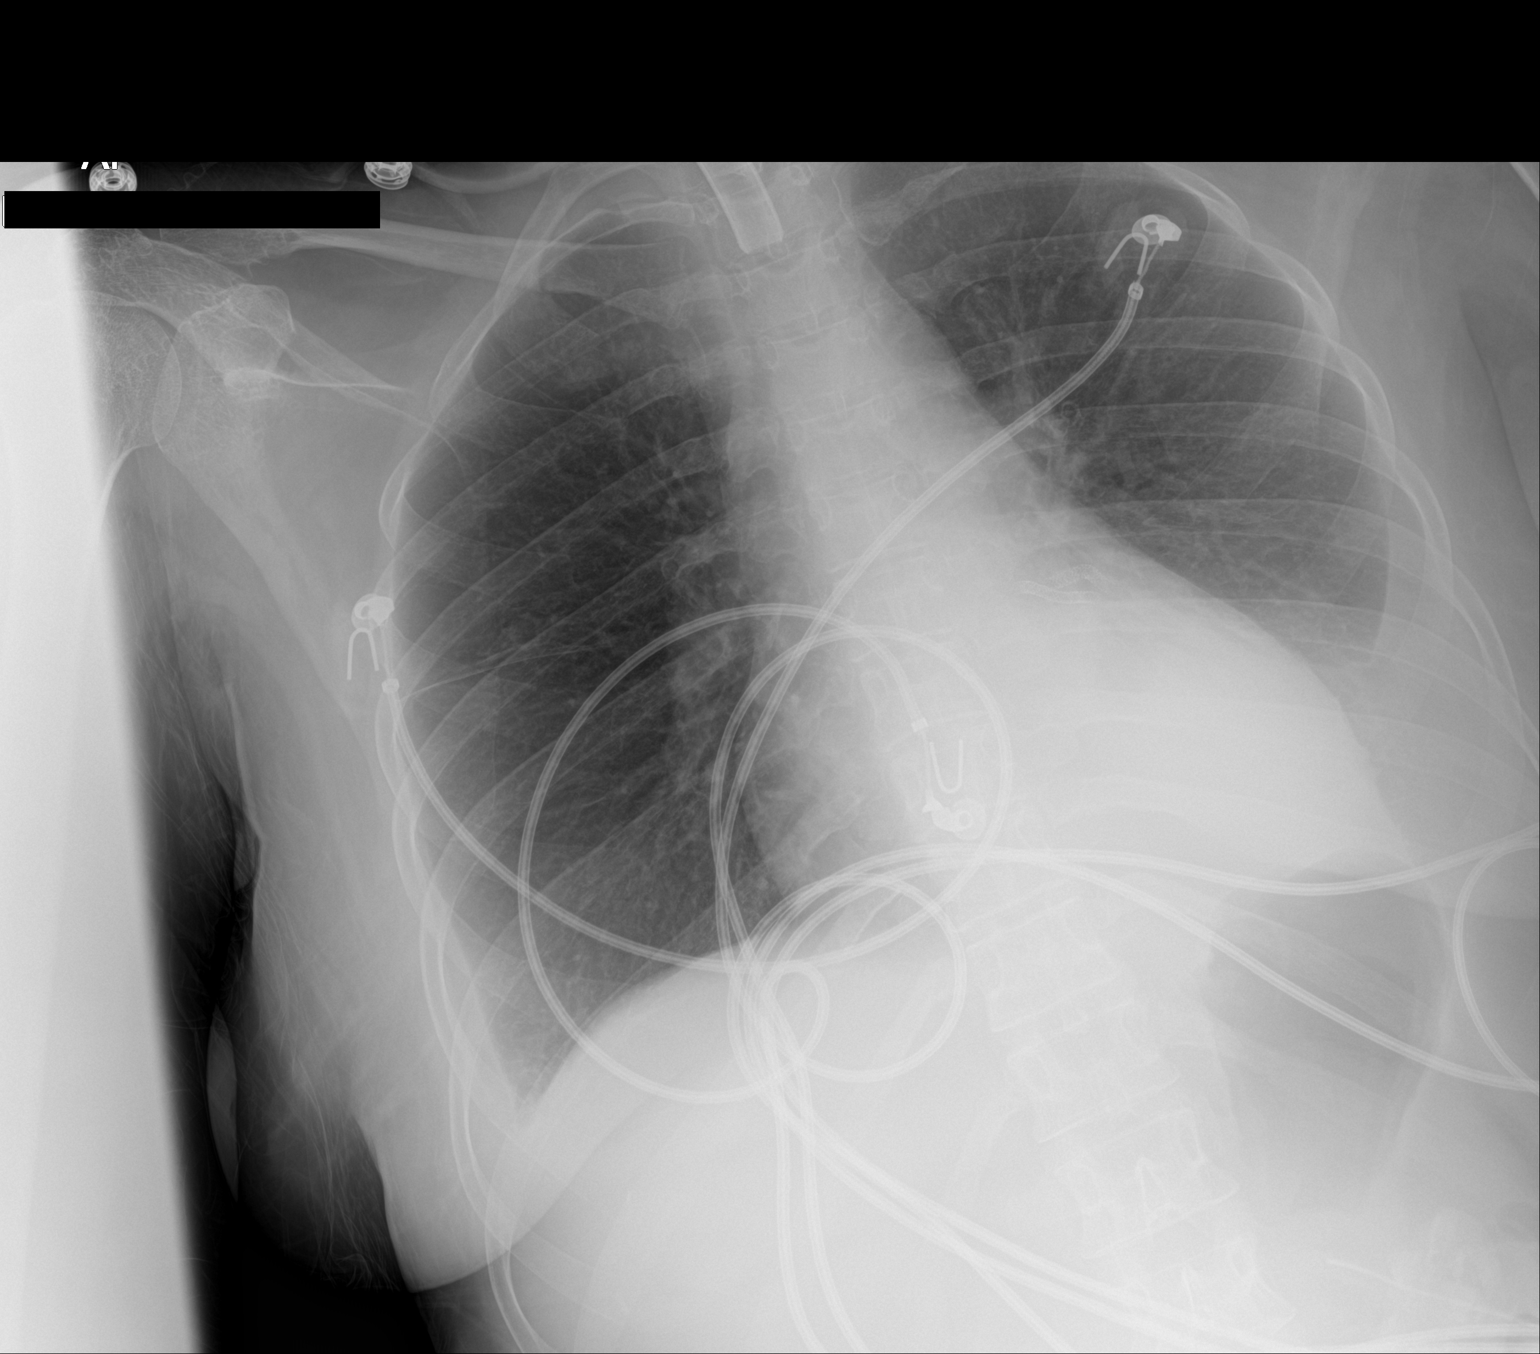

[1 of 1 positions shown; findings below may reference images not displayed]

FINDINGS: Aeration in the right lower chest is markedly improved. Very small
effusion is identified. Left effusion and basilar airspace disease
persist without change. No pneumothorax. Heart size is mildly
enlarged. No pneumothorax.
IMPRESSION: The right lung appears clear with a very small effusion identified.

No change in a left pleural effusion and basilar airspace disease.
